# Patient Record
Sex: Female | Born: 1970 | Race: White | Hispanic: No | Marital: Married | Smoking: Never smoker
Health system: Southern US, Community
[De-identification: ages and names within clinical notes are randomized; demographics above are authoritative.]

## PROBLEM LIST (undated history)

## (undated) DIAGNOSIS — E559 Vitamin D deficiency, unspecified: Secondary | ICD-10-CM

## (undated) DIAGNOSIS — E669 Obesity, unspecified: Secondary | ICD-10-CM

## (undated) DIAGNOSIS — E049 Nontoxic goiter, unspecified: Secondary | ICD-10-CM

## (undated) HISTORY — PX: NO PAST SURGERIES: SHX2092

## (undated) HISTORY — DX: Nontoxic goiter, unspecified: E04.9

## (undated) HISTORY — DX: Obesity, unspecified: E66.9

## (undated) HISTORY — DX: Vitamin D deficiency, unspecified: E55.9

---

## 2007-07-11 ENCOUNTER — Ambulatory Visit: Payer: Self-pay

## 2010-11-27 ENCOUNTER — Ambulatory Visit: Payer: Self-pay

## 2012-10-29 ENCOUNTER — Ambulatory Visit: Payer: Self-pay | Admitting: Obstetrics and Gynecology

## 2012-10-29 LAB — HM MAMMOGRAPHY
HM Mammogram: NEGATIVE
HM Mammogram: NORMAL

## 2014-07-08 LAB — HEMOGLOBIN A1C
HEMOGLOBIN A1C: 5.7 % (ref 4.0–6.0)
Hgb A1c MFr Bld: 5.7 % (ref 4.0–6.0)

## 2014-07-08 LAB — HEPATIC FUNCTION PANEL
ALT: 66 U/L — AB (ref 7–35)
AST: 35 U/L (ref 13–35)
Alkaline Phosphatase: 97 U/L (ref 25–125)
BILIRUBIN, TOTAL: 0.2 mg/dL

## 2014-07-08 LAB — HM PAP SMEAR
HM Pap smear: NEGATIVE
HM Pap smear: NEGATIVE

## 2014-07-08 LAB — LIPID PANEL
CHOLESTEROL: 173 mg/dL (ref 0–200)
Cholesterol: 173 mg/dL (ref 0–200)
HDL: 48 mg/dL (ref 35–70)
HDL: 48 mg/dL (ref 35–70)
LDL CALC: 110 mg/dL
LDL Cholesterol: 110 mg/dL
TRIGLYCERIDES: 73 mg/dL (ref 40–160)
Triglycerides: 73 mg/dL (ref 40–160)

## 2014-07-08 LAB — BASIC METABOLIC PANEL
BUN: 11 mg/dL (ref 4–21)
Creatinine: 0.8 mg/dL (ref 0.5–1.1)
Glucose: 83 mg/dL
Glucose: 83 mg/dL
Potassium: 4.1 mmol/L (ref 3.4–5.3)
Potassium: 4.1 mmol/L (ref 3.4–5.3)
Sodium: 141 mmol/L (ref 137–147)

## 2014-07-08 LAB — CBC AND DIFFERENTIAL
HCT: 38 % (ref 36–46)
HEMOGLOBIN: 12.5 g/dL (ref 12.0–16.0)
PLATELETS: 332 10*3/uL (ref 150–399)

## 2014-07-08 LAB — TSH
TSH: 10 u[IU]/mL — AB (ref 0.41–5.90)
TSH: 10 u[IU]/mL — AB (ref 0.41–5.90)

## 2014-07-16 LAB — THYROGLOBULIN ANTIBODY: THYROGLOBULIN ANTIBODY: 3.6

## 2014-07-20 ENCOUNTER — Other Ambulatory Visit: Payer: Self-pay | Admitting: Obstetrics and Gynecology

## 2014-07-20 DIAGNOSIS — R7989 Other specified abnormal findings of blood chemistry: Secondary | ICD-10-CM

## 2014-07-27 ENCOUNTER — Ambulatory Visit
Admission: RE | Admit: 2014-07-27 | Discharge: 2014-07-27 | Disposition: A | Discharge status: Home / Self Care | Payer: 59 | Source: Ambulatory Visit | Attending: Obstetrics and Gynecology | Admitting: Obstetrics and Gynecology

## 2014-07-27 DIAGNOSIS — E049 Nontoxic goiter, unspecified: Secondary | ICD-10-CM | POA: Insufficient documentation

## 2014-07-27 DIAGNOSIS — R946 Abnormal results of thyroid function studies: Secondary | ICD-10-CM | POA: Insufficient documentation

## 2014-07-27 DIAGNOSIS — R7989 Other specified abnormal findings of blood chemistry: Secondary | ICD-10-CM

## 2014-08-01 DIAGNOSIS — E669 Obesity, unspecified: Secondary | ICD-10-CM

## 2014-08-01 DIAGNOSIS — N926 Irregular menstruation, unspecified: Secondary | ICD-10-CM

## 2014-08-05 ENCOUNTER — Encounter: Payer: Self-pay | Admitting: *Deleted

## 2014-08-05 ENCOUNTER — Ambulatory Visit: Payer: Self-pay | Admitting: Obstetrics and Gynecology

## 2014-08-06 ENCOUNTER — Ambulatory Visit (INDEPENDENT_AMBULATORY_CARE_PROVIDER_SITE_OTHER): Payer: 59

## 2014-08-06 VITALS — BP 142/85 | HR 76 | Ht 64.0 in | Wt 190.1 lb

## 2014-08-06 DIAGNOSIS — E669 Obesity, unspecified: Secondary | ICD-10-CM | POA: Diagnosis not present

## 2014-08-06 MED ORDER — CYANOCOBALAMIN 1000 MCG/ML IJ SOLN
1000.0000 ug | Freq: Once | INTRAMUSCULAR | Status: AC
  Administered 2014-08-06: 1000 ug via INTRAMUSCULAR

## 2014-08-06 NOTE — Progress Notes (Signed)
Patient ID: Pamela Joseph, female   DOB: 1970-09-18, 44 y.o.   MRN: [id number]  Pt presents for wt,bp, b12inj. NO complaints. NO weight change.

## 2014-08-25 ENCOUNTER — Ambulatory Visit (INDEPENDENT_AMBULATORY_CARE_PROVIDER_SITE_OTHER): Payer: 59 | Admitting: Endocrinology

## 2014-08-25 ENCOUNTER — Encounter: Payer: Self-pay | Admitting: Endocrinology

## 2014-08-25 VITALS — BP 138/90 | HR 85 | Temp 97.7°F | Resp 16 | Ht 63.0 in | Wt 191.2 lb

## 2014-08-25 DIAGNOSIS — E038 Other specified hypothyroidism: Secondary | ICD-10-CM | POA: Diagnosis not present

## 2014-08-25 MED ORDER — LEVOTHYROXINE SODIUM 50 MCG PO TABS: 50.0000 ug | ORAL_TABLET | Freq: Every day | ORAL | Status: AC

## 2014-08-25 NOTE — Progress Notes (Signed)
Patient ID: Pamela Joseph, female   DOB: 1971-01-02, 44 y.o.   MRN: [id number]            Reason for Appointment:  Hypothyroidism, new visit    History of Present Illness:   The Hypothyroidism was first diagnosed in 07/2014 Patient was apparently having routine labs done at her annual physical with her gynecologist and screening TSH level was 10.0 and this was confirmed a week later at 12.9 She does not think she has had any evaluation for her thyroid before Patient has not complained of any  fatigue, cold sensitivity, difficulty concentrating, dry skin or hair loss.  She has gained about 10 pounds over the last year which is somewhat unusual for her.     She was also sent for a thyroid ultrasound as her gynecologist felt her thyroid to be enlarged on the right side. Ultrasound shows normal thyroid size and only heterogenous architecture suggestive of thyroiditis        She is now referred here for further management      Lab Results  Component Value Date   TSH 10.00* 07/08/2014   TSH 10.00* 07/08/2014   Other problems: Discussed in review of systems   Past Medical History  Diagnosis Date  . Obesity   . Enlarged thyroid   . Vitamin D deficiency     Past Surgical History  Procedure Laterality Date  . No past surgeries      Family History  Problem Relation Age of Onset  . Diabetes Paternal Grandmother   . Diabetes Mother   . Thyroid disease Mother   . Breast cancer Paternal Aunt   . Thyroid disease Sister     Social History:  reports that she has never smoked. She has never used smokeless tobacco. She reports that she does not drink alcohol or use illicit drugs.  Allergies: No Known Allergies    Medication List       This list is accurate as of: 08/25/14  9:19 PM.  Always use your most recent med list.               etonogestrel-ethinyl estradiol 0.12-0.015 MG/24HR vaginal ring  Commonly known as:  [name] 1 each vaginally every 28  (twenty-eight) days. Insert vaginally and leave in place for 3 consecutive weeks, then remove for 1 week.     etonogestrel-ethinyl estradiol 0.12-0.015 MG/24HR vaginal ring  Commonly known as:  [name] 1 each vaginally every 28 (twenty-eight) days. Insert vaginally and leave in place for 3 consecutive weeks, then remove for 1 week.     ibuprofen 200 MG tablet  Commonly known as:  ADVIL,MOTRIN  Take 200 mg by mouth every 6 (six) hours as needed.     levothyroxine 50 MCG tablet  Commonly known as:  SYNTHROID  Take 1 tablet (50 mcg total) by mouth daily before breakfast.        Review of Systems:  She has gained weight over the last year and does not think she has changed her activity level or diet much She was told by her PCP to start phentermine but has reluctant to take any medications. She is planning to join her work group to start an exercise program and be fit to walk a 5 mile walk  She was started on B-12 injections by her PCP and was told that this will help her weight loss.  However her B-12 level is normal at 401  CARDIOLOGY: no history of  high blood pressure.            GASTROENTEROLOGY:  no Change in bowel habits.      ENDOCRINOLOGY:  no history of Diabetes.          For the last 1-1/2 years she has had infrequent menstrual cycles, sometimes as much as 9 months apart.  She was also having hot flashes since last summer.  She was told she is in early menopause and with starting NuvaRing her hot flashes are better   No history of swelling in her feet  No history of numbness or tingling in her hands or feet   Examination:    BP 138/90 mmHg  Pulse 85  Temp(Src) 97.7 F (36.5 C)  Resp 16  Ht 5\' 3"  (1.6 m)  Wt 191 lb 3.2 oz (86.728 kg)  BMI 33.88 kg/m2  SpO2 96%  LMP 05/23/2014  GENERAL: mild generalized obesity present.   No pallor, clubbing, lymphadenopathy or edema.   Skin:  no rash or pigmentation.  EYES:  No prominence of the eyes or swelling of  the eyelids  ENT: Oral mucosa and tongue normal.  THYROID:  This is barely palpable in the isthmus especially on the left and slightly firm.  Lateral lobes are not palpable even on swallowing  HEART:  Normal  S1 and S2; no murmur or click.  CHEST:    Lungs: Vescicular breath sounds heard equally.  No crepitations/ wheeze.  ABDOMEN:  No distention. Exam not indicated  NEUROLOGICAL: Reflexes are showing minimal delayed relaxation bilaterally at biceps but appear to be normal at ankles.  JOINTS:  Normal.   Assessments 1. 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  Thyroglobulin antibody is abnormally high although not extremely high She is however fairly asymptomatic at this time, not clear weight gain is connected to her mild hypothyroidism.  2.  Mild obesity: she has not been on any specific weight loss program with diet and exercise.   Despite her family history her glucose level appears normal   Treatment:    Although she is appearing relatively asymptomatic with her TSH level in 10-12 range she probably has true hypothyroidism and likely to be progressive over time. She agrees to a trial of 50 g of levothyroxine daily She will be rechecked in 6 weeks with repeat thyroid levels  For her obesity she wants to try and start an exercise program and is not keen on starting phentermine.  Also discussed that this is only a short-term treatment and not free of side effects either She will also stop her B-12 injections as she has no evidence of B-12 deficiency   Pamela Joseph 08/25/2014, 9:19 PM

## 2014-09-02 ENCOUNTER — Telehealth: Payer: Self-pay | Admitting: Obstetrics and Gynecology

## 2014-09-02 NOTE — Telephone Encounter (Signed)
PT CALLED AND WANTED TO LET YOU KNOW THAT SHE SAW THE ENDOCRINOLOGY DOCTOR AND SHE SAID THAT HER DOCTOR RECOMMENDED HER TO STOP TAKING THE MEDICINE FOR THE WEIGHT LOSS. SO SHE HAS AND SHE HAS CANCELLED HER NURSE VISIT FOR THIS MONTH.

## 2014-10-06 ENCOUNTER — Ambulatory Visit: Payer: 59 | Admitting: Endocrinology

## 2014-10-29 ENCOUNTER — Ambulatory Visit: Payer: 59 | Admitting: Endocrinology

## 2014-11-19 ENCOUNTER — Other Ambulatory Visit: Payer: Self-pay | Admitting: Endocrinology

## 2014-11-19 LAB — TSH: TSH: 4.54 u[IU]/mL (ref 0.41–5.90)

## 2014-11-20 LAB — T4, FREE: Free T4: 1.19 ng/dL (ref 0.82–1.77)

## 2014-11-23 ENCOUNTER — Ambulatory Visit (INDEPENDENT_AMBULATORY_CARE_PROVIDER_SITE_OTHER): Payer: 59 | Admitting: Endocrinology

## 2014-11-23 ENCOUNTER — Encounter: Payer: Self-pay | Admitting: Endocrinology

## 2014-11-23 VITALS — BP 128/86 | HR 81 | Temp 98.7°F | Ht 64.0 in | Wt 189.0 lb

## 2014-11-23 DIAGNOSIS — E038 Other specified hypothyroidism: Secondary | ICD-10-CM | POA: Insufficient documentation

## 2014-11-23 NOTE — Progress Notes (Addendum)
Patient ID: Pamela Joseph, female   DOB: October 24, 1970, 44 y.o.   MRN: [id number]            Reason for Appointment:  Hypothyroidism, new visit    History of Present Illness:    Hypothyroidism was first diagnosed in 07/2014  Patient was apparently having routine labs done at her annual physical with her gynecologist and screening TSH level was 10.0 and this was confirmed a week later at 12.9 She does not think she has had any evaluation for her thyroid before Patient has not complained of any  fatigue, cold sensitivity, difficulty concentrating, dry skin or hair loss.  She has gained about 10 pounds over the last year which is somewhat unusual for her.    She was also sent for a thyroid ultrasound as her gynecologist felt her thyroid to be enlarged on the right side. Ultrasound shows normal thyroid size and only heterogenous architecture suggestive of thyroiditis     Because of her positive antithyroid antibodies and relatively high TSH over 10 she was given a trial of Synthroid 50 g daily on her initial visit in 6/16.  She was supposed to follow-up in 6 weeks but did not   She does not feel any better with taking the thyroid supplement, may have a slightly better initially Her lab report is not available today      Lab Results  Component Value Date   TSH 10.00* 07/08/2014   TSH 10.00* 07/08/2014   Other problems: Discussed in review of systems   Past Medical History  Diagnosis Date  . Obesity   . Enlarged thyroid   . Vitamin D deficiency     Past Surgical History  Procedure Laterality Date  . No past surgeries      Family History  Problem Relation Age of Onset  . Diabetes Paternal Grandmother   . Diabetes Mother   . Thyroid disease Mother   . Breast cancer Paternal Aunt   . Thyroid disease Sister     Social History:  reports that she has never smoked. She has never used smokeless tobacco. She reports that she does not drink alcohol or use illicit  drugs.  Allergies: No Known Allergies    Medication List       This list is accurate as of: 11/23/14 11:59 PM.  Always use your most recent med list.               etonogestrel-ethinyl estradiol 0.12-0.015 MG/24HR vaginal ring  Commonly known as:  [place] 1 each vaginally every 28 (twenty-eight) days. Insert vaginally and leave in place for 3 consecutive weeks, then remove for 1 week.     etonogestrel-ethinyl estradiol 0.12-0.015 MG/24HR vaginal ring  Commonly known as:  [place] 1 each vaginally every 28 (twenty-eight) days. Insert vaginally and leave in place for 3 consecutive weeks, then remove for 1 week.     ibuprofen 200 MG tablet  Commonly known as:  ADVIL,MOTRIN  Take 200 mg by mouth every 6 (six) hours as needed.     levothyroxine 50 MCG tablet  Commonly known as:  SYNTHROID  Take 1 tablet (50 mcg total) by mouth daily before breakfast.        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  Even though her TSH was about 13 at baseline she has not felt any better with taking levothyroxine 50 g and TSH is recently reported normal   Treatment:    Although her TSH was high she is not appearing to be symptomatic and not clear if she has any consistent hypothyroidism Discussed that it may be reasonable to watch and wait and only start levothyroxine if her symptoms  are suggestive of hypothyroidism or she has further rise in TSH She will follow-up in 3 months again  [facility] 11/25/2014, 11:59 AM   Addendum: TSH is high normal at 4.5

## 2014-11-25 LAB — SPECIMEN STATUS REPORT

## 2014-11-25 LAB — TSH: TSH: 4.54 u[IU]/mL — ABNORMAL HIGH (ref 0.450–4.500)

## 2015-02-03 ENCOUNTER — Telehealth: Payer: Self-pay | Admitting: Endocrinology

## 2015-02-03 NOTE — Telephone Encounter (Signed)
Pt is asking for her lab orders to be mailed to her house please

## 2015-02-03 NOTE — Telephone Encounter (Signed)
Mailed

## 2015-02-22 ENCOUNTER — Ambulatory Visit: Payer: 59 | Admitting: Endocrinology

## 2015-03-01 ENCOUNTER — Other Ambulatory Visit: Payer: Self-pay | Admitting: Endocrinology

## 2015-03-02 LAB — TSH: TSH: 9.22 u[IU]/mL — ABNORMAL HIGH (ref 0.450–4.500)

## 2015-03-02 LAB — T4, FREE: Free T4: 1 ng/dL (ref 0.82–1.77)

## 2015-03-03 ENCOUNTER — Encounter: Payer: Self-pay | Admitting: Endocrinology

## 2015-03-03 ENCOUNTER — Ambulatory Visit (INDEPENDENT_AMBULATORY_CARE_PROVIDER_SITE_OTHER): Payer: 59 | Admitting: Endocrinology

## 2015-03-03 VITALS — BP 130/88 | HR 99 | Temp 97.8°F | Resp 16 | Ht 64.0 in | Wt 193.8 lb

## 2015-03-03 DIAGNOSIS — E038 Other specified hypothyroidism: Secondary | ICD-10-CM

## 2015-03-03 DIAGNOSIS — E063 Autoimmune thyroiditis: Secondary | ICD-10-CM

## 2015-03-03 NOTE — Patient Instructions (Signed)
Take in am daily

## 2015-03-03 NOTE — Progress Notes (Signed)
Patient ID: Pamela Joseph, female   DOB: [birth year], 44 y.o.   MRN: [id number]            Reason for Appointment:  Hypothyroidism, new visit    History of Present Illness:    Hypothyroidism was first diagnosed in 07/2014  Patient was apparently having routine labs done at her annual physical with her gynecologist and screening TSH level was 10.0 and this was confirmed a week later at 12.9 She does not think she has had any evaluation for her thyroid before Patient has not complained of any  fatigue, cold sensitivity, difficulty concentrating, dry skin or hair loss.  She has gained about 10 pounds over the last year which is somewhat unusual for her.     She was also sent for a thyroid ultrasound as her gynecologist felt her thyroid to be enlarged on the right side. Ultrasound shows normal thyroid size and only heterogenous architecture suggestive of thyroiditis     Because of her positive antithyroid antibodies and relatively high TSH over 10 she was given a trial of Synthroid 50 g daily on her initial visit in 6/16.   She did not feel any better with taking the thyroid supplement, may have been slightly better initially This was subsequently stopped  More recently patient has been starting to feel more tired especially in the evenings when she tends to fall asleep easily.  No cold sensitivity or hair loss Thyroid levels are as follows:  Lab Results  Component Value Date   TSH 9.220* 03/01/2015   TSH 4.540* 11/19/2014   TSH 4.54 11/19/2014   FREET4 1.00 03/01/2015   FREET4 1.19 11/19/2014        Past Medical History  Diagnosis Date  . Obesity   . Enlarged thyroid   . Vitamin D deficiency     Past Surgical History  Procedure Laterality Date  . No past surgeries      Family History  Problem Relation Age of Onset  . Diabetes Paternal Grandmother   . Diabetes Mother   . Thyroid disease Mother   . Breast cancer Paternal Aunt   . Thyroid disease Sister      Social History:  reports that she has never smoked. She has never used smokeless tobacco. She reports that she does not drink alcohol or use illicit drugs.  Allergies: No Known Allergies    Medication List       This list is accurate as of: 03/03/15  3:17 PM.  Always use your most recent med list.               Clobetasol Propionate Emulsion 0.05 % topical foam     etonogestrel-ethinyl estradiol 0.12-0.015 MG/24HR vaginal ring  Commonly known as:  NUVARING  Place 1 each vaginally every 28 (twenty-eight) days. Insert vaginally and leave in place for 3 consecutive weeks, then remove for 1 week.     ibuprofen 200 MG tablet  Commonly known as:  ADVIL,MOTRIN  Take 200 mg by mouth every 6 (six) hours as needed.     levothyroxine 50 MCG tablet  Commonly known as:  SYNTHROID  Take 1 tablet (50 mcg total) by mouth daily before breakfast.        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 Symptomatically she has had more fatigued recently although previously she had not felt any better with a trial of levothyroxine supplementation She does have a small firm goiter on exam today Her TSH is trending higher and at 9   Treatment:    She will try levothyroxine 50 g again to see if it helps her fatigue Will need follow-up TSH on treatment  [name] 03/03/2015, 3:17 PM

## 2015-04-29 ENCOUNTER — Ambulatory Visit: Payer: 59 | Admitting: Endocrinology

## 2015-07-14 ENCOUNTER — Encounter: Payer: 59 | Admitting: Obstetrics and Gynecology

## 2015-09-08 ENCOUNTER — Ambulatory Visit (INDEPENDENT_AMBULATORY_CARE_PROVIDER_SITE_OTHER): Payer: Commercial Managed Care - HMO | Admitting: Obstetrics and Gynecology

## 2015-09-08 ENCOUNTER — Encounter: Payer: Self-pay | Admitting: Obstetrics and Gynecology

## 2015-09-08 VITALS — BP 127/85 | HR 86 | Ht 64.0 in | Wt 194.9 lb

## 2015-09-08 DIAGNOSIS — Z01419 Encounter for gynecological examination (general) (routine) without abnormal findings: Secondary | ICD-10-CM | POA: Diagnosis not present

## 2015-09-08 DIAGNOSIS — L409 Psoriasis, unspecified: Secondary | ICD-10-CM | POA: Diagnosis not present

## 2015-09-08 NOTE — Progress Notes (Signed)
Subjective:   Pamela Joseph is a 45 y.o. G6P0002 Caucasian female here for a routine well-woman exam.  Patient's last menstrual period was 09/06/2015.    Current complaints: menses have spaced out with last one occuring end of Nov 2016 and just started again this week PCP: ?       does desire labs  Social History: Sexual: heterosexual Marital Status: married Living situation: with family Occupation: Network engineer at [employer]: no tobacco use Illicit drugs: no history of illicit drug use  The following portions of the patient's history were reviewed and updated as appropriate: allergies, current medications, past family history, past medical history, past social history, past surgical history and problem list.  Past Medical History Past Medical History  Diagnosis Date  . Obesity   . Enlarged thyroid   . Vitamin D deficiency     Past Surgical History Past Surgical History  Procedure Laterality Date  . No past surgeries      Gynecologic History G2P0002  Patient's last menstrual period was 09/06/2015. Contraception: vasectomy Last Pap: 2015. Results were: normal Last mammogram: 2014. Results were: normal  Obstetric History OB History  Gravida Para Term Preterm AB SAB TAB Ectopic Multiple Living  2 0 0 0 0 0 0 0  2    # Outcome Date GA Lbr Len/2nd Weight Sex Delivery Anes PTL Lv  2 Gravida 2005    M Vag-Spont   Y  1 Gravida 2001    F Vag-Spont   Y      Current Medications Current Outpatient Prescriptions on File Prior to Visit  Medication Sig Dispense Refill  . Clobetasol Propionate Emulsion 0.05 % topical foam Reported on 09/08/2015    . etonogestrel-ethinyl estradiol (NUVARING) 0.12-0.015 MG/24HR vaginal ring Place 1 each vaginally every 28 (twenty-eight) days. Reported on 09/08/2015    . ibuprofen (ADVIL,MOTRIN) 200 MG tablet Take 200 mg by mouth every 6 (six) hours as needed. Reported on 09/08/2015    . levothyroxine (SYNTHROID) 50 MCG tablet Take  1 tablet (50 mcg total) by mouth daily before breakfast. (Patient not taking: Reported on 03/03/2015) 30 tablet 3   No current facility-administered medications on file prior to visit.    Review of Systems Patient denies any headaches, blurred vision, shortness of breath, chest pain, abdominal pain, problems with bowel movements, urination, or intercourse.  Objective:  BP 127/85 mmHg  Pulse 86  Ht 5\' 4"  (1.626 m)  Wt 194 lb 14.4 oz (88.406 kg)  BMI 33.44 kg/m2  LMP 09/06/2015 Physical Exam  General:  Well developed, well nourished, no acute distress. She is alert and oriented x3. Skin:  Warm and dry Neck:  Midline trachea, no thyromegaly or nodules Cardiovascular: Regular rate and rhythm, no murmur heard Lungs:  Effort normal, all lung fields clear to auscultation bilaterally Breasts:  No dominant palpable mass, retraction, or nipple discharge Abdomen:  Soft, non tender, no hepatosplenomegaly or masses Pelvic:  External genitalia is normal in appearance.  The vagina is normal in appearance. The cervix is bulbous, no CMT.  Thin prep pap is not done. Uterus is felt to be normal size, shape, and contour.  No adnexal masses or tenderness noted. Extremities:  No swelling or varicosities noted Psych:  She has a normal mood and affect  Assessment:   Healthy well-woman exam Psoriasis Obesity Peri-menopausal bleeding  Plan:  Labs obtained F/U 1 year for AE, or sooner if needed Mammogram scheduled  Gerad Cornelio Rockney Ghee, CNM

## 2015-09-08 NOTE — Patient Instructions (Signed)
  Place annual gynecologic exam patient instructions here.  Thank you for enrolling in [facility]. Please follow the instructions below to securely access your online medical record. MyChart allows you to send messages to your doctor, view your test results, manage appointments, and more.   How Do I Sign Up? 1. In your Internet browser, go to [employer] and enter [email or web address]. 2. Click on the Sign Up Now link in the Sign In box. You will see the New Member Sign Up page. 3. Enter your MyChart Access Code exactly as it appears below. You will not need to use this code after you've completed the sign-up process. If you do not sign up before the expiration date, you must request a new code.  MyChart Access Code: [email or web address] Expires: 09/11/2015 10:51 AM  4. Enter your Social Security Number ([id number]) and Date of Birth (mm/dd/yyyy) as indicated and click Submit. You will be taken to the next sign-up page. 5. Create a MyChart ID. This will be your MyChart login ID and cannot be changed, so think of one that is secure and easy to remember. 6. Create a MyChart password. You can change your password at any time. 7. Enter your Password Reset Question and Answer. This can be used at a later time if you forget your password.  8. Enter your e-mail address. You will receive e-mail notification when new information is available in [facility]. 9. Click Sign Up. You can now view your medical record.   Additional Information Remember, MyChart is NOT to be used for urgent needs. For medical emergencies, dial 911.

## 2015-09-09 ENCOUNTER — Other Ambulatory Visit: Payer: Self-pay | Admitting: Obstetrics and Gynecology

## 2015-09-09 LAB — CBC
Hematocrit: 37.9 % (ref 34.0–46.6)
Hemoglobin: 11.9 g/dL (ref 11.1–15.9)
MCH: 26.8 pg (ref 26.6–33.0)
MCHC: 31.4 g/dL — AB (ref 31.5–35.7)
MCV: 85 fL (ref 79–97)
Platelets: 278 10*3/uL (ref 150–379)
RBC: 4.44 x10E6/uL (ref 3.77–5.28)
RDW: 14.4 % (ref 12.3–15.4)
WBC: 7.9 10*3/uL (ref 3.4–10.8)

## 2015-09-09 LAB — COMPREHENSIVE METABOLIC PANEL WITH GFR
ALT: 57 [IU]/L — ABNORMAL HIGH (ref 0–32)
AST: 23 [IU]/L (ref 0–40)
Albumin/Globulin Ratio: 1.5 (ref 1.2–2.2)
Albumin: 4 g/dL (ref 3.5–5.5)
Alkaline Phosphatase: 82 [IU]/L (ref 39–117)
BUN/Creatinine Ratio: 10 (ref 9–23)
BUN: 8 mg/dL (ref 6–24)
Bilirubin Total: 0.3 mg/dL (ref 0.0–1.2)
CO2: 24 mmol/L (ref 18–29)
Calcium: 9.1 mg/dL (ref 8.7–10.2)
Chloride: 102 mmol/L (ref 96–106)
Creatinine, Ser: 0.8 mg/dL (ref 0.57–1.00)
GFR calc Af Amer: 104 mL/min/{1.73_m2}
GFR calc non Af Amer: 90 mL/min/{1.73_m2}
Globulin, Total: 2.7 g/dL (ref 1.5–4.5)
Glucose: 92 mg/dL (ref 65–99)
Potassium: 4 mmol/L (ref 3.5–5.2)
Sodium: 140 mmol/L (ref 134–144)
Total Protein: 6.7 g/dL (ref 6.0–8.5)

## 2015-09-09 LAB — HEMOGLOBIN A1C
ESTIMATED AVERAGE GLUCOSE: 111 mg/dL
HEMOGLOBIN A1C: 5.5 % (ref 4.8–5.6)

## 2015-09-09 LAB — LIPID PANEL
CHOLESTEROL TOTAL: 164 mg/dL (ref 100–199)
Chol/HDL Ratio: 3.7 ratio units (ref 0.0–4.4)
HDL: 44 mg/dL (ref >39)
LDL Calculated: 104 mg/dL — ABNORMAL HIGH (ref 0–99)
TRIGLYCERIDES: 82 mg/dL (ref 0–149)
VLDL Cholesterol Cal: 16 mg/dL (ref 5–40)

## 2015-09-09 LAB — TSH: TSH: 7.79 u[IU]/mL — ABNORMAL HIGH (ref 0.450–4.500)

## 2015-09-09 LAB — VITAMIN D 25 HYDROXY (VIT D DEFICIENCY, FRACTURES): Vit D, 25-Hydroxy: 29.4 ng/mL — ABNORMAL LOW (ref 30.0–100.0)

## 2016-08-28 DIAGNOSIS — Z85820 Personal history of malignant melanoma of skin: Secondary | ICD-10-CM | POA: Diagnosis not present

## 2016-09-11 ENCOUNTER — Encounter: Payer: Commercial Managed Care - HMO | Admitting: Obstetrics and Gynecology

## 2016-11-15 ENCOUNTER — Encounter: Payer: Commercial Managed Care - HMO | Admitting: Obstetrics and Gynecology

## 2017-01-10 ENCOUNTER — Other Ambulatory Visit: Payer: Self-pay | Admitting: Obstetrics and Gynecology

## 2017-01-10 ENCOUNTER — Encounter: Payer: Self-pay | Admitting: Obstetrics and Gynecology

## 2017-01-10 ENCOUNTER — Ambulatory Visit (INDEPENDENT_AMBULATORY_CARE_PROVIDER_SITE_OTHER): Payer: 59 | Admitting: Obstetrics and Gynecology

## 2017-01-10 VITALS — BP 128/84 | HR 99 | Ht 64.0 in | Wt 198.6 lb

## 2017-01-10 DIAGNOSIS — Z01419 Encounter for gynecological examination (general) (routine) without abnormal findings: Secondary | ICD-10-CM

## 2017-01-10 MED ORDER — CYANOCOBALAMIN 1000 MCG/ML IJ SOLN: 1000.0000 ug | INTRAMUSCULAR | 1 refills | Status: AC

## 2017-01-10 MED ORDER — PHENTERMINE HCL 37.5 MG PO TABS: 37.5000 mg | ORAL_TABLET | Freq: Every day | ORAL | 2 refills | Status: AC

## 2017-01-10 MED ORDER — ETONOGESTREL-ETHINYL ESTRADIOL 0.12-0.015 MG/24HR VA RING: 1.0000 | VAGINAL_RING | VAGINAL | 12 refills | Status: AC

## 2017-01-10 NOTE — Progress Notes (Signed)
Subjective:   Pamela Joseph is a 46 y.o. G67P0002 Caucasian female here for a routine well-woman exam.  No LMP recorded.    Current complaints: unhappy with weight PCP: me       does desire labs  Social History: Sexual: heterosexual Marital Status: married Living situation: with family Occupation: Research scientist (physical sciences) at [employer] Tobacco/alcohol: no tobacco use Illicit drugs: no history of illicit drug use  The following portions of the patient's history were reviewed and updated as appropriate: allergies, current medications, past family history, past medical history, past social history, past surgical history and problem list.  Past Medical History Past Medical History:  Diagnosis Date  . Enlarged thyroid   . Obesity   . Vitamin D deficiency     Past Surgical History Past Surgical History:  Procedure Laterality Date  . NO PAST SURGERIES      Gynecologic History G2P0002  No LMP recorded. Contraception: NuvaRing vaginal inserts Last Pap: 2016. Results were: normal Last mammogram: 2014. Results were: normal   Obstetric History OB History  Gravida Para Term Preterm AB Living  2 0 0 0 0 2  SAB TAB Ectopic Multiple Live Births  0 0 0   2    # Outcome Date GA Lbr Len/2nd Weight Sex Delivery Anes PTL Lv  2 Gravida 2005    M Vag-Spont   LIV  1 Gravida 2001    F Vag-Spont   LIV      Current Medications Current Outpatient Medications on File Prior to Visit  Medication Sig Dispense Refill  . Clobetasol Propionate Emulsion 0.05 % topical foam Reported on 09/08/2015    . etonogestrel-ethinyl estradiol (NUVARING) 0.12-0.015 MG/24HR vaginal ring Place 1 each vaginally every 28 (twenty-eight) days. Reported on 09/08/2015    . ibuprofen (ADVIL,MOTRIN) 200 MG tablet Take 200 mg by mouth every 6 (six) hours as needed. Reported on 09/08/2015    . levothyroxine (SYNTHROID) 50 MCG tablet Take 1 tablet (50 mcg total) by mouth daily before breakfast. (Patient not taking: Reported on  03/03/2015) 30 tablet 3   No current facility-administered medications on file prior to visit.     Review of Systems Patient denies any headaches, blurred vision, shortness of breath, chest pain, abdominal pain, problems with bowel movements, urination, or intercourse.  Objective:  BP 128/84   Pulse 99   Ht 5\' 4"  (1.626 m)   Wt 198 lb 9.6 oz (90.1 kg)   BMI 34.09 kg/m  Physical Exam  General:  Well developed, well nourished, no acute distress. She is alert and oriented x3. Skin:  Warm and dry Neck:  Midline trachea, no thyromegaly or nodules Cardiovascular: Regular rate and rhythm, no murmur heard Lungs:  Effort normal, all lung fields clear to auscultation bilaterally Breasts:  No dominant palpable mass, retraction, or nipple discharge Abdomen:  Soft, non tender, no hepatosplenomegaly or masses Pelvic:  External genitalia is normal in appearance.  The vagina is normal in appearance. The cervix is bulbous, no CMT.  Thin prep pap is done with HR HPV cotesting. Uterus is felt to be normal size, shape, and contour.  No adnexal masses or tenderness noted. Extremities:  No swelling or varicosities noted Psych:  She has a normal mood and affect  Assessment:   Healthy well-woman exam Obesity HC user Thyroid disorder  Plan:  Discussed weight loss. Will restart weight loss medications and first B12 given today. F/U 1 year for AE, or sooner if needed Mammogram ordered or sooner if problems  Severino Paolo Rockney Ghee, CNM

## 2017-01-11 LAB — LIPID PANEL
CHOL/HDL RATIO: 4 ratio (ref 0.0–4.4)
CHOLESTEROL TOTAL: 174 mg/dL (ref 100–199)
HDL: 43 mg/dL (ref >39)
LDL CALC: 111 mg/dL — AB (ref 0–99)
TRIGLYCERIDES: 98 mg/dL (ref 0–149)
VLDL Cholesterol Cal: 20 mg/dL (ref 5–40)

## 2017-01-11 LAB — COMPREHENSIVE METABOLIC PANEL
A/G RATIO: 1.8 (ref 1.2–2.2)
ALT: 48 IU/L — AB (ref 0–32)
AST: 25 IU/L (ref 0–40)
Albumin: 4.6 g/dL (ref 3.5–5.5)
Alkaline Phosphatase: 81 IU/L (ref 39–117)
BUN/Creatinine Ratio: 12 (ref 9–23)
BUN: 9 mg/dL (ref 6–24)
CALCIUM: 9.6 mg/dL (ref 8.7–10.2)
CHLORIDE: 103 mmol/L (ref 96–106)
CO2: 29 mmol/L (ref 20–29)
Creatinine, Ser: 0.76 mg/dL (ref 0.57–1.00)
GFR, EST AFRICAN AMERICAN: 109 mL/min/{1.73_m2} (ref >59)
GFR, EST NON AFRICAN AMERICAN: 94 mL/min/{1.73_m2} (ref >59)
GLOBULIN, TOTAL: 2.6 g/dL (ref 1.5–4.5)
Glucose: 86 mg/dL (ref 65–99)
POTASSIUM: 4.2 mmol/L (ref 3.5–5.2)
SODIUM: 143 mmol/L (ref 134–144)
TOTAL PROTEIN: 7.2 g/dL (ref 6.0–8.5)

## 2017-01-11 LAB — THYROID PANEL WITH TSH
Free Thyroxine Index: 1.7 (ref 1.2–4.9)
T3 Uptake Ratio: 22 % — ABNORMAL LOW (ref 24–39)
T4 TOTAL: 7.6 ug/dL (ref 4.5–12.0)
TSH: 8.84 u[IU]/mL — ABNORMAL HIGH (ref 0.450–4.500)

## 2017-01-11 LAB — HEMOGLOBIN A1C
Est. average glucose Bld gHb Est-mCnc: 120 mg/dL
HEMOGLOBIN A1C: 5.8 % — AB (ref 4.8–5.6)

## 2017-01-14 ENCOUNTER — Telehealth: Payer: Self-pay | Admitting: *Deleted

## 2017-01-14 ENCOUNTER — Telehealth: Payer: Self-pay | Admitting: Obstetrics and Gynecology

## 2017-01-14 ENCOUNTER — Encounter: Payer: Self-pay | Admitting: Obstetrics and Gynecology

## 2017-01-14 DIAGNOSIS — R7989 Other specified abnormal findings of blood chemistry: Secondary | ICD-10-CM

## 2017-01-14 LAB — CYTOLOGY - PAP

## 2017-01-14 NOTE — Telephone Encounter (Signed)
Patient LVM that she would like a referral to Dr Eddie Dibbles @ [facility]

## 2017-01-14 NOTE — Telephone Encounter (Signed)
Referral put in for endo [place]

## 2017-01-14 NOTE — Telephone Encounter (Signed)
Sent to [facility]

## 2017-02-11 ENCOUNTER — Ambulatory Visit: Payer: 59 | Admitting: Obstetrics and Gynecology

## 2017-05-21 DIAGNOSIS — D485 Neoplasm of uncertain behavior of skin: Secondary | ICD-10-CM | POA: Diagnosis not present

## 2017-05-21 DIAGNOSIS — Z85820 Personal history of malignant melanoma of skin: Secondary | ICD-10-CM | POA: Diagnosis not present

## 2017-05-29 DIAGNOSIS — D0462 Carcinoma in situ of skin of left upper limb, including shoulder: Secondary | ICD-10-CM | POA: Diagnosis not present

## 2017-11-22 DIAGNOSIS — H1032 Unspecified acute conjunctivitis, left eye: Secondary | ICD-10-CM | POA: Diagnosis not present

## 2017-12-27 DIAGNOSIS — E039 Hypothyroidism, unspecified: Secondary | ICD-10-CM | POA: Diagnosis not present

## 2017-12-27 DIAGNOSIS — Z0000 Encounter for general adult medical examination without abnormal findings: Secondary | ICD-10-CM | POA: Diagnosis not present

## 2017-12-27 DIAGNOSIS — R030 Elevated blood-pressure reading, without diagnosis of hypertension: Secondary | ICD-10-CM | POA: Diagnosis not present

## 2017-12-31 DIAGNOSIS — E039 Hypothyroidism, unspecified: Secondary | ICD-10-CM | POA: Diagnosis not present

## 2017-12-31 DIAGNOSIS — Z0000 Encounter for general adult medical examination without abnormal findings: Secondary | ICD-10-CM | POA: Diagnosis not present

## 2018-01-14 ENCOUNTER — Encounter: Payer: 59 | Admitting: Obstetrics and Gynecology

## 2018-01-21 DIAGNOSIS — D1801 Hemangioma of skin and subcutaneous tissue: Secondary | ICD-10-CM | POA: Diagnosis not present

## 2018-01-21 DIAGNOSIS — D485 Neoplasm of uncertain behavior of skin: Secondary | ICD-10-CM | POA: Diagnosis not present

## 2018-01-21 DIAGNOSIS — X32XXXA Exposure to sunlight, initial encounter: Secondary | ICD-10-CM | POA: Diagnosis not present

## 2018-01-21 DIAGNOSIS — L570 Actinic keratosis: Secondary | ICD-10-CM | POA: Diagnosis not present

## 2018-01-21 DIAGNOSIS — L400 Psoriasis vulgaris: Secondary | ICD-10-CM | POA: Diagnosis not present

## 2018-02-11 ENCOUNTER — Encounter: Payer: 59 | Admitting: Obstetrics and Gynecology

## 2021-07-06 ENCOUNTER — Other Ambulatory Visit: Payer: Self-pay | Admitting: Family Medicine

## 2021-07-06 DIAGNOSIS — Z1231 Encounter for screening mammogram for malignant neoplasm of breast: Secondary | ICD-10-CM

## 2021-08-04 ENCOUNTER — Ambulatory Visit
Admission: RE | Admit: 2021-08-04 | Discharge: 2021-08-04 | Disposition: A | Discharge status: Home / Self Care | Payer: 59 | Source: Ambulatory Visit | Attending: Family Medicine | Admitting: Family Medicine

## 2021-08-04 DIAGNOSIS — Z1231 Encounter for screening mammogram for malignant neoplasm of breast: Secondary | ICD-10-CM | POA: Insufficient documentation

## 2022-07-11 ENCOUNTER — Other Ambulatory Visit: Payer: Self-pay | Admitting: Family Medicine

## 2022-07-11 DIAGNOSIS — Z1231 Encounter for screening mammogram for malignant neoplasm of breast: Secondary | ICD-10-CM

## 2022-09-10 ENCOUNTER — Ambulatory Visit
Admission: RE | Admit: 2022-09-10 | Discharge: 2022-09-10 | Disposition: A | Discharge status: Home / Self Care | Payer: 59 | Source: Ambulatory Visit | Attending: Family Medicine | Admitting: Family Medicine

## 2022-09-10 DIAGNOSIS — Z1231 Encounter for screening mammogram for malignant neoplasm of breast: Secondary | ICD-10-CM | POA: Insufficient documentation

## 2023-06-04 IMAGING — MG MM DIGITAL SCREENING BILAT W/ TOMO AND CAD
8 series · 8 of 24 positions shown · non-contrast
Comparison: Previous exam(s).

CLINICAL DATA: Screening.

EXAM:
DIGITAL SCREENING BILATERAL MAMMOGRAM WITH TOMOSYNTHESIS AND CAD
TECHNIQUE: Bilateral screening digital craniocaudal and mediolateral oblique
mammograms were obtained. Bilateral screening digital breast
tomosynthesis was performed. The images were evaluated with
computer-aided detection.

[L MLO synth-2D]
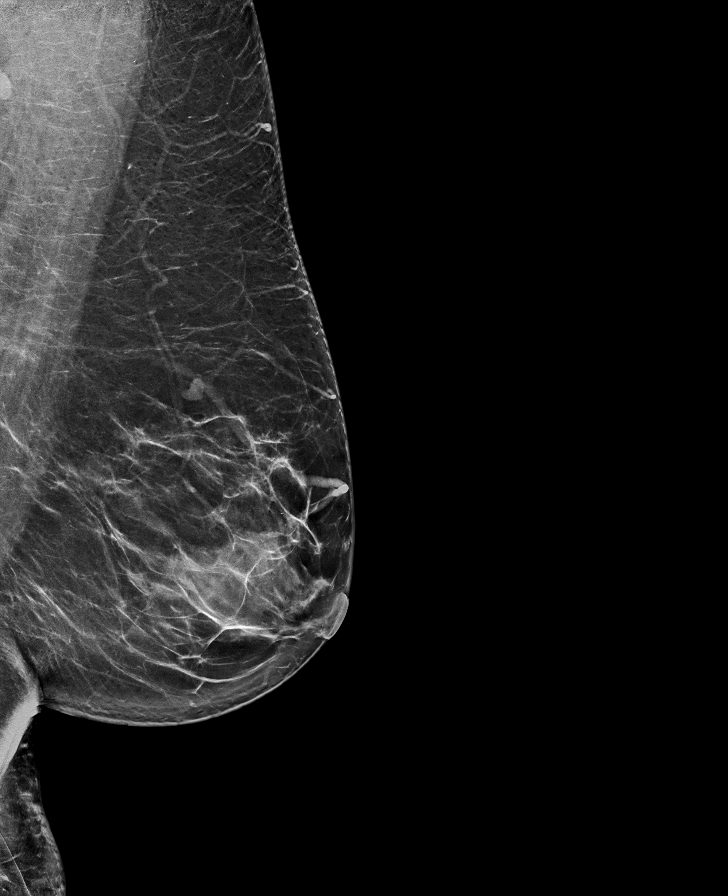

[R CC synth-2D]
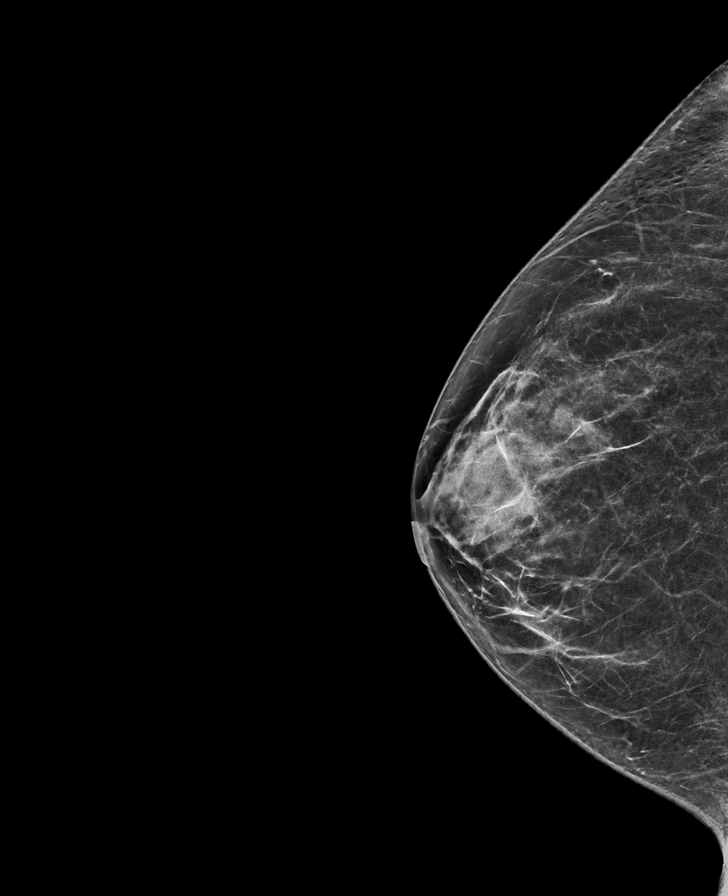

[R MLO synth-2D]
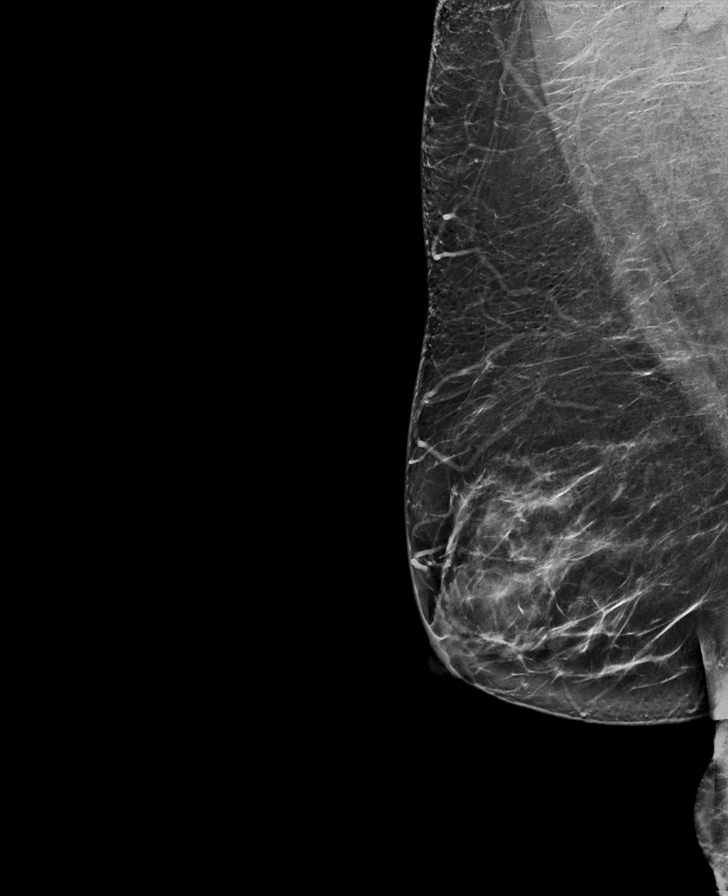

[L CC synth-2D]
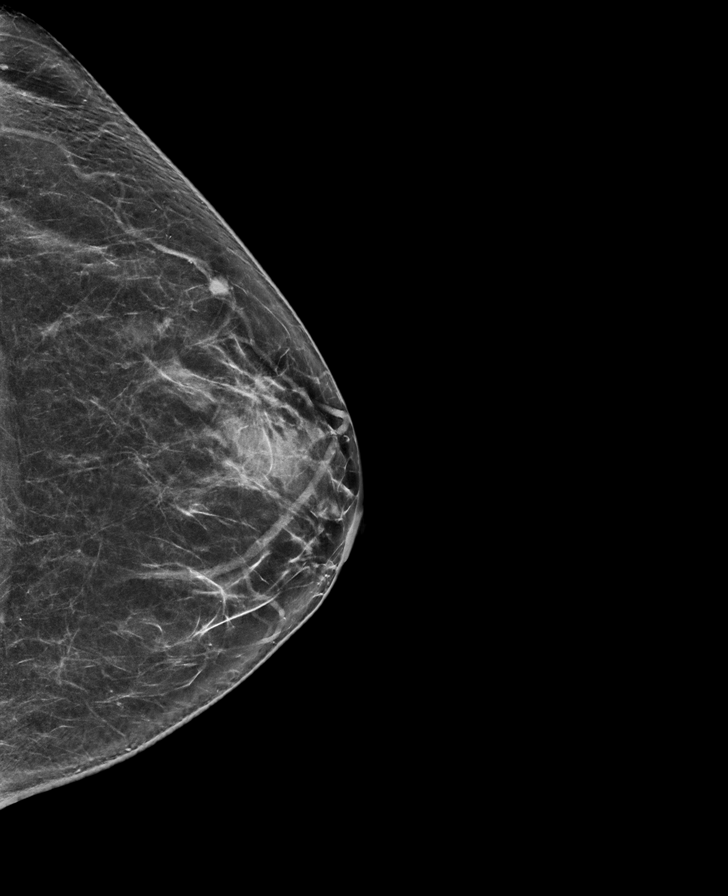

[R CC tomo · tomo slice 37/74.0]
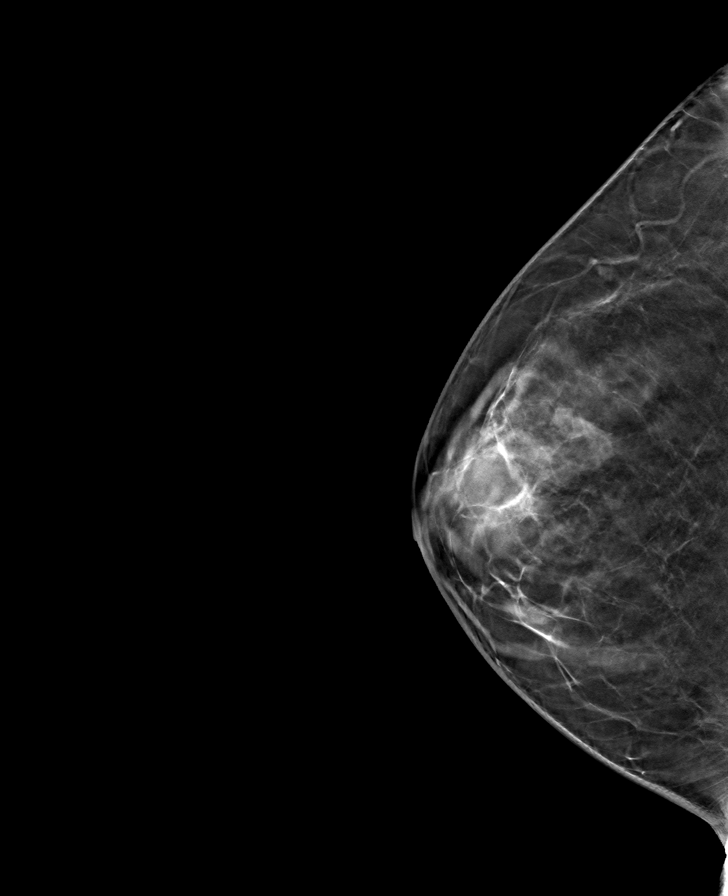

[R MLO tomo · tomo slice 45/90.0]
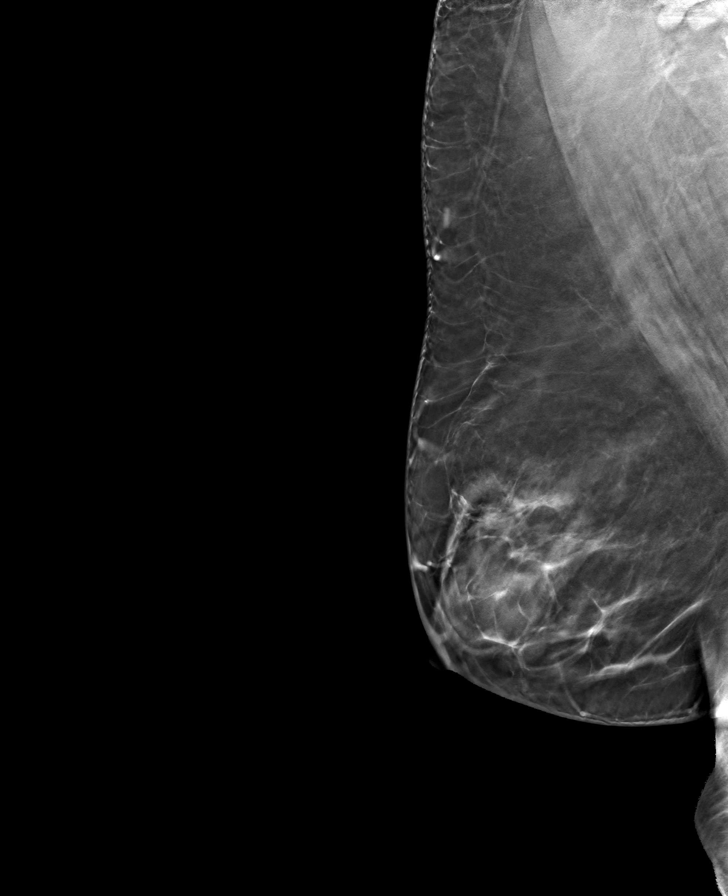

[L CC tomo · tomo slice 39/78.0]
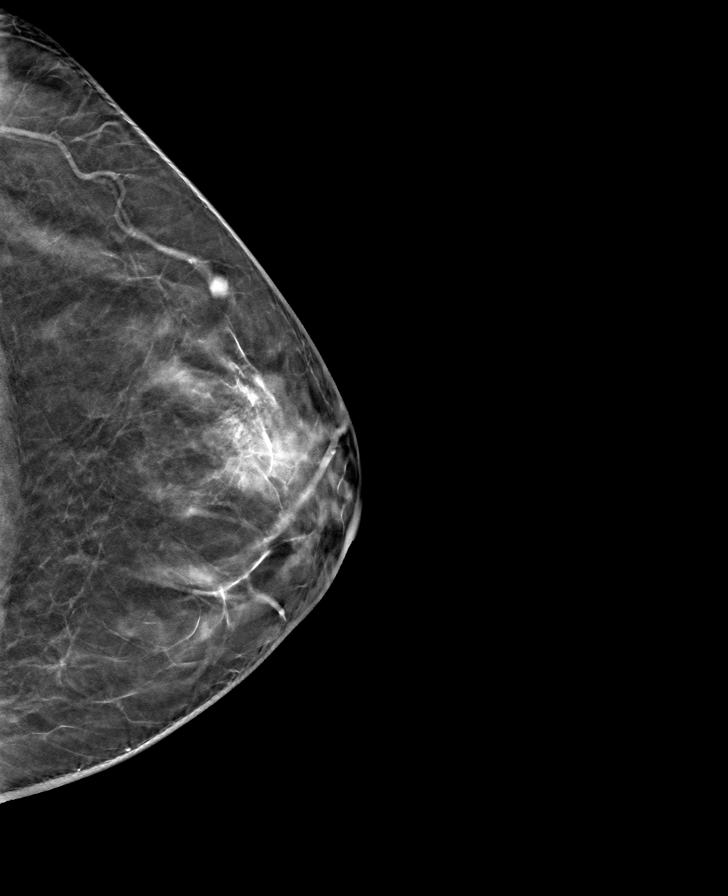

[L MLO tomo · tomo slice 45/88.0]
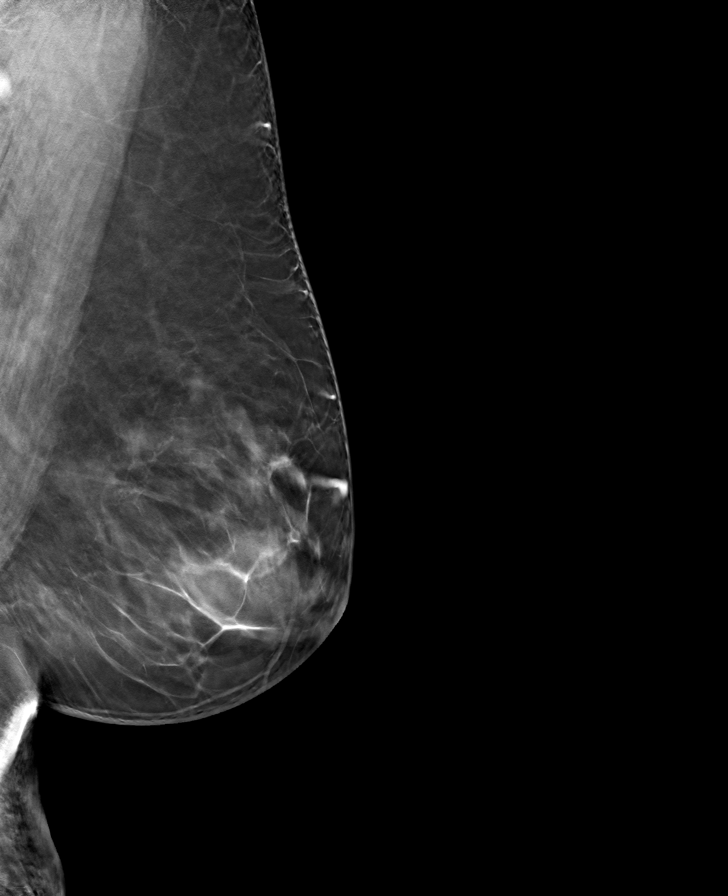

[8 of 24 positions shown; findings below may reference images not displayed]

ACR Breast Density Category c: The breast tissue is heterogeneously
dense, which may obscure small masses.
FINDINGS: There are no findings suspicious for malignancy.
IMPRESSION: No mammographic evidence of malignancy. A result letter of this
screening mammogram will be mailed directly to the patient.

RECOMMENDATION:
Screening mammogram in one year. (Code:Q3-W-BC3)

BI-RADS CATEGORY  1: Negative.

## 2023-08-05 ENCOUNTER — Ambulatory Visit: Admitting: Physician Assistant

## 2023-08-05 ENCOUNTER — Encounter: Payer: Self-pay | Admitting: Physician Assistant

## 2023-08-05 VITALS — BP 144/83

## 2023-08-05 DIAGNOSIS — Z85820 Personal history of malignant melanoma of skin: Secondary | ICD-10-CM

## 2023-08-05 DIAGNOSIS — Z8589 Personal history of malignant neoplasm of other organs and systems: Secondary | ICD-10-CM

## 2023-08-05 DIAGNOSIS — Z86018 Personal history of other benign neoplasm: Secondary | ICD-10-CM | POA: Diagnosis not present

## 2023-08-05 DIAGNOSIS — L409 Psoriasis, unspecified: Secondary | ICD-10-CM

## 2023-08-05 DIAGNOSIS — Z86006 Personal history of melanoma in-situ: Secondary | ICD-10-CM

## 2023-08-05 HISTORY — DX: Personal history of malignant melanoma of skin: Z85.820

## 2023-08-05 NOTE — Patient Instructions (Signed)

## 2023-08-05 NOTE — Progress Notes (Deleted)
   New Patient Visit   Subjective  Pamela Joseph is a 53 y.o. female who presents for the following: Skin Cancer Screening and Full Body Skin Exam  The patient presents for Total-Body Skin Exam (TBSE) for skin cancer screening and mole check. The patient has spots, moles and lesions to be evaluated, some may be new or changing and the patient may have concern these could be cancer.    The following portions of the chart were reviewed this encounter and updated as appropriate: medications, allergies, medical history  Review of Systems:  No other skin or systemic complaints except as noted in HPI or Assessment and Plan.  Objective  Well appearing patient in no apparent distress; mood and affect are within normal limits.  A full examination was performed including scalp, head, eyes, ears, nose, lips, neck, chest, axillae, abdomen, back, buttocks, bilateral upper extremities, bilateral lower extremities, hands, feet, fingers, toes, fingernails, and toenails. All findings within normal limits unless otherwise noted below.   Relevant physical exam findings are noted in the Assessment and Plan.    Assessment & Plan   SKIN CANCER SCREENING PERFORMED TODAY.  ACTINIC DAMAGE - Chronic condition, secondary to cumulative UV/sun exposure - diffuse scaly erythematous macules with underlying dyspigmentation - Recommend daily broad spectrum sunscreen SPF 30+ to sun-exposed areas, reapply every 2 hours as needed.  - Staying in the shade or wearing long sleeves, sun glasses (UVA+UVB protection) and wide brim hats (4-inch brim around the entire circumference of the hat) are also recommended for sun protection.  - Call for new or changing lesions.  LENTIGINES, SEBORRHEIC KERATOSES, HEMANGIOMAS - Benign normal skin lesions - Benign-appearing - Call for any changes  MELANOCYTIC NEVI - Tan-brown and/or pink-flesh-colored symmetric macules and papules - Benign appearing on exam today -  Observation - Call clinic for new or changing moles - Recommend daily use of broad spectrum spf 30+ sunscreen to sun-exposed areas.   DERMATOFIBROMA Exam: Firm pink/brown papulenodule with dimple sign of right shoulder.  Treatment Plan: A dermatofibroma is a benign growth possibly related to trauma, such as an insect bite, cut from shaving, or inflamed acne-type bump.  Treatment options to remove include shave or excision with resulting scar and risk of recurrence.  Since benign-appearing and not bothersome, will observe for now.         Return in about 1 year (around 08/04/2024) for TBSE.  I, Eliot Guernsey, CMA, am acting as scribe for SANDRIDGE,BRENDA K, PA-C .   Documentation: I have reviewed the above documentation for accuracy and completeness, and I agree with the above.  SANDRIDGE,BRENDA K, PA-C

## 2023-08-05 NOTE — Progress Notes (Signed)
   New Patient Visit   Subjective  Pamela Joseph is a 53 y.o. 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 Had good reaction.   Patient has been followed by myself, Mrytle Bento K. Thomasene Dubow, PA-C at [facility] and wishes to establish care here at [facility]. Skin history includes melanoma, dysplastic nevi and psoriasis. Her psoriasis is well controlled on Skyrizi. Last full skin exam was in January. Patient has no other skin concerns today. Declined full skin exam today.   Prior records will be requested so to abstract into her [facility] chart.     The following portions of the chart were reviewed this encounter and updated as appropriate: medications, allergies, medical history  Review of Systems:  No other skin or systemic complaints except as noted in HPI or Assessment and Plan.  Objective  Well appearing patient in no apparent distress; mood and affect are within normal limits.    A focused examination was performed of the following areas: face, scalp and arms.     Relevant exam findings are noted in the Assessment and Plan.    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  - Recommend regular full body skin exams - Recommend daily broad spectrum sunscreen SPF 30+ to sun-exposed areas, reapply every 2 hours as needed.  - Call if any new or changing lesions are noted between office visits    HISTORY OF MELANOMA - Recommend regular full body skin exams - Recommend daily broad spectrum sunscreen SPF 30+ to sun-exposed areas, reapply every 2 hours as needed.  - Call if any new or changing lesions are noted between office visits - Prior records to be requested today.    HISTORY OF DYSPLASTIC NEVUS -No evidence of recurrence today -Recommend regular full body skin exams -Recommend daily broad spectrum sunscreen SPF 30+ to  sun-exposed areas, reapply every 2 hours as needed.  -Call if any new or changing lesions are noted between office visits   PSORIASIS Exam: Clear today  Well controlled  Treatment Plan: Continue Skyrizi injections every 3 months.  HISTORY OF SQUAMOUS CELL CARCINOMA   HISTORY OF MELANOMA   HISTORY OF DYSPLASTIC NEVUS   PSORIASIS   PERSONAL HISTORY OF MALIGNANT MELANOMA OF SKIN    Return in about 6 months (around 02/04/2024) for TBSE and Skyrizi appointment.  I, Eliot Guernsey, CMA, am acting as scribe for Rudy Luhmann K, PA-C .   Documentation: I have reviewed the above documentation for accuracy and completeness, and I agree with the above.  Lazara Grieser K, PA-C

## 2024-01-03 ENCOUNTER — Ambulatory Visit

## 2024-01-03 DIAGNOSIS — K5730 Diverticulosis of large intestine without perforation or abscess without bleeding: Secondary | ICD-10-CM | POA: Diagnosis not present

## 2024-01-03 DIAGNOSIS — K640 First degree hemorrhoids: Secondary | ICD-10-CM | POA: Diagnosis not present

## 2024-01-03 DIAGNOSIS — Z1211 Encounter for screening for malignant neoplasm of colon: Secondary | ICD-10-CM | POA: Diagnosis present

## 2024-02-19 ENCOUNTER — Ambulatory Visit: Admitting: Physician Assistant

## 2024-04-06 ENCOUNTER — Ambulatory Visit: Admitting: Physician Assistant

## 2024-04-16 ENCOUNTER — Ambulatory Visit: Admitting: Physician Assistant
